# Patient Record
Sex: Male | Born: 1995 | Race: White | Hispanic: No | Marital: Single | Smoking: Never smoker
Health system: Southern US, Community
[De-identification: ages and names within clinical notes are randomized; demographics above are authoritative.]

---
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
## 2021-07-03 ENCOUNTER — Ambulatory Visit: Payer: Self-pay | Admitting: Family Medicine

## 2021-09-22 ENCOUNTER — Encounter (HOSPITAL_BASED_OUTPATIENT_CLINIC_OR_DEPARTMENT_OTHER): Payer: Self-pay

## 2021-09-22 ENCOUNTER — Other Ambulatory Visit: Payer: Self-pay

## 2021-09-22 DIAGNOSIS — S6991XA Unspecified injury of right wrist, hand and finger(s), initial encounter: Secondary | ICD-10-CM | POA: Diagnosis present

## 2021-09-22 DIAGNOSIS — W260XXA Contact with knife, initial encounter: Secondary | ICD-10-CM | POA: Insufficient documentation

## 2021-09-22 DIAGNOSIS — S61411A Laceration without foreign body of right hand, initial encounter: Secondary | ICD-10-CM | POA: Insufficient documentation

## 2021-09-22 NOTE — ED Triage Notes (Signed)
Patient here POV from Home with Laceration.  Endorses lacerating his Right Medial Hand with Steak Knife approximately 0.5 hours PTA.   Laceration is 4-5 cm in Length. Irrigated and wrapped in Triage.   Tetanus updated 2 years ago. No Blood Thinner Medication history.   NAD noted during Triage. A&Ox4. GCS 15. Ambulatory.

## 2021-09-23 ENCOUNTER — Emergency Department (HOSPITAL_BASED_OUTPATIENT_CLINIC_OR_DEPARTMENT_OTHER)
Admission: EM | Admit: 2021-09-23 | Discharge: 2021-09-23 | Disposition: A | Discharge status: Home / Self Care | Payer: 59 | Attending: Emergency Medicine | Admitting: Emergency Medicine

## 2021-09-23 ENCOUNTER — Other Ambulatory Visit (HOSPITAL_BASED_OUTPATIENT_CLINIC_OR_DEPARTMENT_OTHER): Payer: Self-pay

## 2021-09-23 DIAGNOSIS — S61411A Laceration without foreign body of right hand, initial encounter: Secondary | ICD-10-CM

## 2021-09-23 MED ORDER — CEPHALEXIN 250 MG PO CAPS
1000.0000 mg | ORAL_CAPSULE | Freq: Once | ORAL | Status: AC
  Administered 2021-09-23: 1000 mg via ORAL
  Filled 2021-09-23: qty 4

## 2021-09-23 MED ORDER — LIDOCAINE-EPINEPHRINE (PF) 2 %-1:200000 IJ SOLN: 20.0000 mL | Freq: Once | INTRAMUSCULAR | Status: AC

## 2021-09-23 MED ORDER — CEPHALEXIN 500 MG PO CAPS: 500.0000 mg | ORAL_CAPSULE | Freq: Four times a day (QID) | ORAL | 0 refills | Status: AC

## 2021-09-23 MED ORDER — NAPROXEN 250 MG PO TABS
500.0000 mg | ORAL_TABLET | Freq: Once | ORAL | Status: AC
  Administered 2021-09-23: 500 mg via ORAL
  Filled 2021-09-23: qty 2

## 2021-09-23 MED ORDER — CEPHALEXIN 500 MG PO CAPS
ORAL_CAPSULE | ORAL | 0 refills | Status: AC
  Filled 2021-09-23: qty 20, 5d supply, fill #0

## 2021-09-23 MED ORDER — LIDOCAINE-EPINEPHRINE (PF) 2 %-1:200000 IJ SOLN
INTRAMUSCULAR | Status: AC
  Administered 2021-09-23: 20 mL
  Filled 2021-09-23: qty 20

## 2021-09-23 NOTE — ED Provider Notes (Signed)
[facility] Provider Note: Sean Dell, MD, FACEP  CSN: [id number] MRN: [id number] ARRIVAL: 09/22/21 at 2212 ROOM: DB001/DB001   CHIEF COMPLAINT  Laceration   HISTORY OF PRESENT ILLNESS  09/23/21 2:08 AM Sean Evans is a 26 y.o. male who cut his right hand with a steak knife while eating steak approximately 30 minutes prior to arrival.  He has a laceration to the ulnar side of the right hand.  There is some protruding muscle but the patient has no functional or sensory deficit.  He rates associated pain as a 6 out of 10, worse with movement.  Tetanus is up-to-date.  The knife was contaminated with meat juice when it cut him.  Wound was copiously irrigated and then bandaged in triage.   History reviewed. No pertinent past medical history.  Past Surgical History:  Procedure Laterality Date   LAPAROSCOPIC APPENDECTOMY N/A 05/04/2014   Procedure: APPENDECTOMY LAPAROSCOPIC;  Surgeon: Judie Petit. Leonia Corona, MD;  Location: [facility] OR;  Service: Pediatrics;  Laterality: N/A;    Family History  Problem Relation Age of Onset   Hypertension Paternal Grandmother    Heart disease Paternal Grandfather     Social History   Tobacco Use   Smoking status: Never  Substance Use Topics   Alcohol use: Never   Drug use: Never    Prior to Admission medications   Medication Sig Start Date End Date Taking? Authorizing Provider  cephALEXin (KEFLEX) 500 MG capsule Take 1 capsule (500 mg total) by mouth 4 (four) times daily. 09/23/21  Yes Javonne Louissaint, Yahsir, MD    Allergies Azithromycin and Penicillins   REVIEW OF SYSTEMS  Negative except as noted here or in the History of Present Illness.   PHYSICAL EXAMINATION  Initial Vital Signs Blood pressure (!) 142/94, pulse 98, temperature 98.6 F (37 C), resp. rate 18, height 6\' 1"  (1.854 m), weight 62.1 kg, SpO2 100 %.  Examination General: Well-developed, well-nourished male in no acute distress; appearance consistent with age of record HENT:  normocephalic; atraumatic Eyes: Normal appearance Neck: supple Heart: regular rate and rhythm Lungs: clear to auscultation bilaterally Abdomen: soft; nondistended Extremities: No deformity; full range of motion; laceration to ulnar side of right hand with protruding muscle belly, no functional, sensory or circulatory deficit:    Neurologic: Awake, alert and oriented; motor function intact in all extremities and symmetric; no facial droop Skin: Warm and dry Psychiatric: Normal mood and affect   RESULTS  Summary of this visit's results, reviewed and interpreted by myself:   EKG Interpretation  Date/Time:    Ventricular Rate:    PR Interval:    QRS Duration:   QT Interval:    QTC Calculation:   R Axis:     Text Interpretation:         Laboratory Studies: No results found for this or any previous visit (from the past 24 hour(s)). Imaging Studies: No results found.  ED COURSE and MDM  Nursing notes, initial and subsequent vitals signs, including pulse oximetry, reviewed and interpreted by myself.  Vitals:   09/22/21 2229 09/22/21 2236  BP: (!) 142/94   Pulse: 98   Resp: 18   Temp: 98.6 F (37 C)   SpO2: 100%   Weight:  62.1 kg  Height:  6\' 1"  (1.854 m)   Medications  lidocaine-EPINEPHrine (XYLOCAINE W/EPI) 2 %-1:200000 (PF) injection 20 mL (has no administration in time range)  lidocaine-EPINEPHrine (XYLOCAINE W/EPI) 2 %-1:200000 (PF) injection (has no administration in time range)  cephALEXin (KEFLEX)  capsule 1,000 mg (has no administration in time range)  naproxen (NAPROSYN) tablet 500 mg (has no administration in time range)   Because the patient's wound was deep enough to involve the muscle and the knife was contaminated with steak juice we will start him on an antibiotic.  He was advised to return for signs/symptoms of infection.   PROCEDURES  Procedures LACERATION REPAIR Performed by: Carlisle Beers Asyia Hornung Authorized by: Carlisle Beers Cloma Rahrig Consent: Verbal consent  obtained. Risks and benefits: risks, benefits and alternatives were discussed Consent given by: patient Patient identity confirmed: provided demographic data Prepped and Draped in normal sterile fashion Wound explored, muscle body replaced into wound  Laceration Location: Right hand  Laceration Length: 2 cm  No Foreign Bodies seen or palpated  Anesthesia: local infiltration  Local anesthetic: lidocaine 2% with epinephrine  Anesthetic total: 5 ml  Irrigation method: Copious Amount of cleaning: standard  Skin closure: 4-0 Vicryl  Number of sutures: 4  Technique: Simple interrupted  Patient tolerance: Patient tolerated the procedure well with no immediate complications.     ED DIAGNOSES     ICD-10-CM   1. Laceration of right hand with complication, initial encounter  S61.411A          Paula Libra, MD 09/23/21 [phone]
